# Patient Record
Sex: Male | Born: 1940 | Hispanic: No | Smoking: Never smoker
Health system: Southern US, Community
[De-identification: ages and names within clinical notes are randomized; demographics above are authoritative.]

## PROBLEM LIST (undated history)

## (undated) DIAGNOSIS — I219 Acute myocardial infarction, unspecified: Secondary | ICD-10-CM

## (undated) DIAGNOSIS — I6529 Occlusion and stenosis of unspecified carotid artery: Secondary | ICD-10-CM

## (undated) DIAGNOSIS — I2510 Atherosclerotic heart disease of native coronary artery without angina pectoris: Secondary | ICD-10-CM

## (undated) DIAGNOSIS — E119 Type 2 diabetes mellitus without complications: Secondary | ICD-10-CM

## (undated) HISTORY — DX: Type 2 diabetes mellitus without complications: E11.9

## (undated) HISTORY — DX: Occlusion and stenosis of unspecified carotid artery: I65.29

## (undated) HISTORY — DX: Acute myocardial infarction, unspecified: I21.9

## (undated) HISTORY — DX: Atherosclerotic heart disease of native coronary artery without angina pectoris: I25.10

---

## 1997-05-30 HISTORY — PX: CORONARY ARTERY BYPASS GRAFT: SHX141

## 2004-05-30 HISTORY — PX: OTHER SURGICAL HISTORY: SHX169

## 2009-12-23 ENCOUNTER — Ambulatory Visit (HOSPITAL_BASED_OUTPATIENT_CLINIC_OR_DEPARTMENT_OTHER): Admission: RE | Admit: 2009-12-23 | Discharge: 2009-12-23 | Payer: Self-pay | Admitting: Ophthalmology

## 2010-08-14 LAB — POCT I-STAT 4, (NA,K, GLUC, HGB,HCT)
Glucose, Bld: 151 mg/dL — ABNORMAL HIGH (ref 70–99)
HCT: 45 % (ref 39.0–52.0)
Hemoglobin: 15.3 g/dL (ref 13.0–17.0)

## 2015-08-11 ENCOUNTER — Other Ambulatory Visit: Payer: Self-pay | Admitting: *Deleted

## 2015-08-11 DIAGNOSIS — I6521 Occlusion and stenosis of right carotid artery: Secondary | ICD-10-CM

## 2015-09-04 ENCOUNTER — Encounter: Payer: Self-pay | Admitting: Vascular Surgery

## 2015-09-15 ENCOUNTER — Ambulatory Visit (INDEPENDENT_AMBULATORY_CARE_PROVIDER_SITE_OTHER): Payer: Medicare Other | Admitting: Vascular Surgery

## 2015-09-15 ENCOUNTER — Encounter: Payer: Self-pay | Admitting: Vascular Surgery

## 2015-09-15 ENCOUNTER — Ambulatory Visit (HOSPITAL_COMMUNITY)
Admission: RE | Admit: 2015-09-15 | Discharge: 2015-09-15 | Disposition: A | Discharge status: Home / Self Care | Payer: Medicare Other | Source: Ambulatory Visit | Attending: Vascular Surgery | Admitting: Vascular Surgery

## 2015-09-15 VITALS — BP 133/75 | HR 56 | Temp 98.6°F | Resp 18 | Ht 71.0 in | Wt 181.6 lb

## 2015-09-15 DIAGNOSIS — E119 Type 2 diabetes mellitus without complications: Secondary | ICD-10-CM | POA: Diagnosis not present

## 2015-09-15 DIAGNOSIS — I6521 Occlusion and stenosis of right carotid artery: Secondary | ICD-10-CM

## 2015-09-15 DIAGNOSIS — I2510 Atherosclerotic heart disease of native coronary artery without angina pectoris: Secondary | ICD-10-CM | POA: Diagnosis not present

## 2015-09-15 NOTE — Progress Notes (Signed)
[facility]  Patient name: Maxwell Chan MRN: [id number] DOB: [birth year] Sex: male  REASON FOR VISIT: Carotid stenosis, he is referred by Dr.Chiu from [facility]  HPI: Maxwell Chan is a 75 y.o. male,  who presents for evaluation of right carotid stenosis. His right carotid stenosis was found on routine medical screening. He had a carotid duplex performed at an outside facility that revealed approximately 72% right internal carotid artery stenosis. He had repeat carotid duplex here.  The patient denies a history of TIA or stroke. He denies a history of amaurosis fugax, sudden onset numbness or weakness, and expressive or receptive aphasia. He does describe floaters in his right eye. This occurred 6 months ago and has been evaluated by an ophthalmologist.   The patient is retired. He stays active and exercises regularly. 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 He has never been a smoker. He has diabetes managed on oral medications. He is on a statin for hyperlipidemia.  He denies any recent history of chest pain or shortness of breath.  Past Medical History  Diagnosis Date  . Diabetes mellitus without complication (HCC)   . Carotid artery occlusion   . Myocardial infarction (HCC)   . Coronary heart disease     Family History  Problem Relation Age of Onset  . Heart disease Mother   . Heart disease Father     SOCIAL HISTORY: Social History   Social History  . Marital Status: Divorced    Spouse Name: N/A  . Number of Children: N/A  . Years of Education: N/A   Occupational History  . Not on file.   Social History Main Topics  . Smoking status: Never Smoker   . Smokeless tobacco: Not on file  . Alcohol Use: Yes     Comment: 1 glass of wine nightly  . Drug Use: No  . Sexual Activity: Not on file   Other Topics Concern  . Not on file   Social History Narrative    . No narrative on file    No Known Allergies  Current Outpatient Prescriptions  Medication Sig Dispense Refill  . aspirin 81 MG chewable tablet Chew 81 mg by mouth daily.    . clopidogrel (PLAVIX) 75 MG tablet Take 75 mg by mouth daily.    Marland Kitchen lisinopril (PRINIVIL,ZESTRIL) 2.5 MG tablet Take 2.5 mg by mouth daily.    . metFORMIN (GLUCOPHAGE) 500 MG tablet 500 mg 2 (two) times daily.    . metoprolol succinate (TOPROL-XL) 25 MG 24 hr tablet Take 25 mg by mouth daily.    Marland Kitchen omeprazole (PRILOSEC) 40 MG capsule daily.    . rosuvastatin (CRESTOR) 40 MG tablet Take 40 mg by mouth daily.    . sildenafil (VIAGRA) 50 MG tablet Take 50 mg by mouth.     No current facility-administered medications for this visit.    REVIEW OF SYSTEMS:   denotes positive finding,  denotes negative finding Cardiac  Comments:  Chest pain or chest pressure:    Shortness of breath upon exertion:    Short of breath when lying flat:    Irregular heart rhythm:        Vascular    Pain in calf, thigh, or hip brought on by ambulation:    Pain in feet at night that wakes you up from your sleep:     Blood clot in your veins:  Leg swelling:         Pulmonary    Oxygen at home:    Productive cough:     Wheezing:         Neurologic    Sudden weakness in arms or legs:     Sudden numbness in arms or legs:     Sudden onset of difficulty speaking or slurred speech:    Temporary loss of vision in one eye:     Problems with dizziness:         Gastrointestinal    Blood in stool:     Vomited blood:         Genitourinary    Burning when urinating:     Blood in urine:        Psychiatric    Major depression:         Hematologic    Bleeding problems:    Problems with blood clotting too easily:        Skin    Rashes or ulcers:        Constitutional    Fever or chills:      PHYSICAL EXAM: Filed Vitals:   09/15/15 1313  BP: 133/75  Pulse: 56  Temp: 98.6 F (37 C)  TempSrc: Oral  Resp: 18   Height: 5\' 11"  (1.803 m)  Weight: 181 lb 9.6 oz (82.373 kg)  SpO2: 100%    GENERAL: The patient is a well-nourished male, in no acute distress. The vital signs are documented above. CARDIAC: There is a regular rate and rhythm. No carotid bruits VASCULAR: 2+ radial pulses symmetric.  PULMONARY: There is good air exchange bilaterally without wheezing or rales. ABDOMEN: Soft and non-tender. No palpable masses. Non-palpable aorta. MUSCULOSKELETAL: There are no major deformities or cyanosis. NEUROLOGIC: 5 out of 5 strength upper and lower extremities bilaterally. Cranial nerves II-XII grossly intact. No focal weakness or paresthesias are detected. SKIN: There are no ulcers or rashes noted. PSYCHIATRIC: The patient has a normal affect.  DATA:  Right carotid duplex 09/15/2015  Right internal carotid artery stenosis 60-79%. Velocities 256/66. Right vertebral flow is antegrade  Duplex from outside facility on 08/04/2015 reveals approximately 72% right proximal ICA stenosis.  Less than 40% stenosis left internal carotid artery. Antegrade left vertebral artery flow.   MEDICAL ISSUES: Asymptomatic right internal carotid artery stenosis 60-79%  The patient has had no symptoms of TIA or stroke. The patient is on maximal medical management with aspirin and statin. He is not a smoker. Discussed carotid endarterectomy if his stenosis reaches 80% or if he becomes symptomatic. He will follow up in 6 months with repeat carotid duplex. He knows to contact the office sooner if he develops any TIA or stroke symptoms.   Maris BergerKimberly Trinh, PA-C [facility] [place] [phone]    I have examined the patient, reviewed and agree with above. Had a long discussion with the patient and his daughter present. Explain symptoms of carotid disease. Recommend that we see him again at 6 month intervals to rule out any progression in his stenosis. Explained that he is somewhat below her recommended  threshold for elective repair with no symptoms.  Gretta BeganEarly, Kathren Scearce, MD 09/15/2015 2:07 PM

## 2015-10-23 ENCOUNTER — Other Ambulatory Visit: Payer: Self-pay | Admitting: *Deleted

## 2015-10-23 DIAGNOSIS — I6523 Occlusion and stenosis of bilateral carotid arteries: Secondary | ICD-10-CM

## 2016-02-27 ENCOUNTER — Other Ambulatory Visit: Payer: Self-pay | Admitting: Internal Medicine

## 2016-02-27 DIAGNOSIS — M5440 Lumbago with sciatica, unspecified side: Secondary | ICD-10-CM

## 2016-03-04 ENCOUNTER — Other Ambulatory Visit: Payer: Self-pay | Admitting: Internal Medicine

## 2016-03-04 DIAGNOSIS — M545 Low back pain, unspecified: Secondary | ICD-10-CM

## 2016-03-18 ENCOUNTER — Inpatient Hospital Stay: Admission: RE | Admit: 2016-03-18 | Payer: Medicare Other | Source: Ambulatory Visit

## 2016-03-24 ENCOUNTER — Encounter: Payer: Self-pay | Admitting: Family

## 2016-03-29 ENCOUNTER — Ambulatory Visit (INDEPENDENT_AMBULATORY_CARE_PROVIDER_SITE_OTHER): Payer: Medicare Other | Admitting: Family

## 2016-03-29 ENCOUNTER — Ambulatory Visit (HOSPITAL_COMMUNITY)
Admission: RE | Admit: 2016-03-29 | Discharge: 2016-03-29 | Disposition: A | Discharge status: Home / Self Care | Payer: Medicare Other | Source: Ambulatory Visit | Attending: Vascular Surgery | Admitting: Vascular Surgery

## 2016-03-29 ENCOUNTER — Encounter: Payer: Self-pay | Admitting: Family

## 2016-03-29 VITALS — BP 127/64 | HR 48 | Temp 98.1°F | Resp 18 | Ht 71.0 in | Wt 170.0 lb

## 2016-03-29 DIAGNOSIS — I6523 Occlusion and stenosis of bilateral carotid arteries: Secondary | ICD-10-CM

## 2016-03-29 DIAGNOSIS — I6521 Occlusion and stenosis of right carotid artery: Secondary | ICD-10-CM | POA: Diagnosis not present

## 2016-03-29 NOTE — Patient Instructions (Signed)
Stroke Prevention Some medical conditions and behaviors are associated with an increased chance of having a stroke. You may prevent a stroke by making healthy choices and managing medical conditions. HOW CAN I REDUCE MY RISK OF HAVING A STROKE?   Stay physically active. Get at least 30 minutes of activity on most or all days.  Do not smoke. It may also be helpful to avoid exposure to secondhand smoke.  Limit alcohol use. Moderate alcohol use is considered to be:  No more than 2 drinks per day for men.  No more than 1 drink per day for nonpregnant women.  Eat healthy foods. This involves:  Eating 5 or more servings of fruits and vegetables a day.  Making dietary changes that address high blood pressure (hypertension), high cholesterol, diabetes, or obesity.  Manage your cholesterol levels.  Making food choices that are high in fiber and low in saturated fat, trans fat, and cholesterol may control cholesterol levels.  Take any prescribed medicines to control cholesterol as directed by your health care provider.  Manage your diabetes.  Controlling your carbohydrate and sugar intake is recommended to manage diabetes.  Take any prescribed medicines to control diabetes as directed by your health care provider.  Control your hypertension.  Making food choices that are low in salt (sodium), saturated fat, trans fat, and cholesterol is recommended to manage hypertension.  Ask your health care provider if you need treatment to lower your blood pressure. Take any prescribed medicines to control hypertension as directed by your health care provider.  If you are 18-39 years of age, have your blood pressure checked every 3-5 years. If you are 40 years of age or older, have your blood pressure checked every year.  Maintain a healthy weight.  Reducing calorie intake and making food choices that are low in sodium, saturated fat, trans fat, and cholesterol are recommended to manage  weight.  Stop drug abuse.  Avoid taking birth control pills.  Talk to your health care provider about the risks of taking birth control pills if you are over 35 years old, smoke, get migraines, or have ever had a blood clot.  Get evaluated for sleep disorders (sleep apnea).  Talk to your health care provider about getting a sleep evaluation if you snore a lot or have excessive sleepiness.  Take medicines only as directed by your health care provider.  For some people, aspirin or blood thinners (anticoagulants) are helpful in reducing the risk of forming abnormal blood clots that can lead to stroke. If you have the irregular heart rhythm of atrial fibrillation, you should be on a blood thinner unless there is a good reason you cannot take them.  Understand all your medicine instructions.  Make sure that other conditions (such as anemia or atherosclerosis) are addressed. SEEK IMMEDIATE MEDICAL CARE IF:   You have sudden weakness or numbness of the face, arm, or leg, especially on one side of the body.  Your face or eyelid droops to one side.  You have sudden confusion.  You have trouble speaking (aphasia) or understanding.  You have sudden trouble seeing in one or both eyes.  You have sudden trouble walking.  You have dizziness.  You have a loss of balance or coordination.  You have a sudden, severe headache with no known cause.  You have new chest pain or an irregular heartbeat. Any of these symptoms may represent a serious problem that is an emergency. Do not wait to see if the symptoms will   go away. Get medical help at once. Call your local emergency services (911 in [place] Do not drive yourself to the hospital.   This information is not intended to replace advice given to you by your health care provider. Make sure you discuss any questions you have with your health care provider.   Document Released: 06/23/2004 Document Revised: 06/06/2014 Document Reviewed:  11/16/2012 Elsevier Interactive Patient Education 2016 [employer] Inc[employer]  

## 2016-03-29 NOTE — Progress Notes (Signed)
Chief Complaint: Follow up Extracranial Carotid Artery Stenosis   History of Present Illness  Maxwell Chan is a 75 y.o. male patient that Dr. Arbie CookeyEarly and Karsten RoKim Trinh PA-C initially evaluated on 09/15/15 for right carotid artery stenosis. His right carotid stenosis was found on routine medical screening. He had a carotid duplex performed at an outside facility that revealed approximately 72% right internal carotid artery stenosis. He had repeat carotid duplex here.  The patient denies a history of TIA or stroke. He denies a history of amaurosis fugax, sudden onset numbness or weakness, and expressive or receptive aphasia. He does describe floaters in his right eye. This has been evaluated by an ophthalmologist.   The patient is retired. He stays active and exercises regularly.  He has never been a smoker. He has diabetes managed on oral medications. He is on a statin for hyperlipidemia.  He denies any recent history of chest pain or shortness of breath. He denies any feeling of light headedness.   Patient has not had previous carotid artery intervention.  He had 3 vessel CABG in 2000, denies any hx of MI, had a couple of cardiac stents placed since then.  Pt Diabetic: yes, states his last A1C was 7.? Pt smoker: non-smoker  Pt meds include: Statin : yes ASA: yes Other anticoagulants/antiplatelets: Plavix     Past Medical History:  Diagnosis Date  . Carotid artery occlusion   . Coronary heart disease   . Diabetes mellitus without complication (HCC)   . Myocardial infarction     Social History Social History  Substance Use Topics  . Smoking status: Never Smoker  . Smokeless tobacco: Never Used  . Alcohol use Yes     Comment: 1 glass of wine nightly    Family History Family History  Problem Relation Age of Onset  . Heart disease Mother   . Heart disease Father     Surgical History Past Surgical History:  Procedure Laterality Date  . cardiac stents  2006  .  CORONARY ARTERY BYPASS GRAFT  1999    No Known Allergies  Current Outpatient Prescriptions  Medication Sig Dispense Refill  . aspirin 81 MG chewable tablet Chew 81 mg by mouth daily.    . clopidogrel (PLAVIX) 75 MG tablet Take 75 mg by mouth daily.    Marland Kitchen. lisinopril (PRINIVIL,ZESTRIL) 2.5 MG tablet Take 2.5 mg by mouth daily.    . metFORMIN (GLUCOPHAGE) 500 MG tablet 500 mg 2 (two) times daily.    . metoprolol succinate (TOPROL-XL) 25 MG 24 hr tablet Take 25 mg by mouth daily.    Marland Kitchen. omeprazole (PRILOSEC) 40 MG capsule daily.    . rosuvastatin (CRESTOR) 40 MG tablet Take 40 mg by mouth daily.    . sildenafil (VIAGRA) 50 MG tablet Take 50 mg by mouth.     No current facility-administered medications for this visit.     Review of Systems : See HPI for pertinent positives and negatives.  Physical Examination  Vitals:   03/29/16 1345  BP: 139/66  Pulse: (!) 48  Resp: 18  SpO2: 100%  Weight: 170 lb (77.1 kg)  Height: 5\' 11"  (1.803 m)   Body mass index is 23.71 kg/m.  General: WDWN male in NAD GAIT: normal Eyes: PERRLA Pulmonary:  Respirations are non-labored, good air movement, CTAB,  rales,  rhonchi, &  wheezing.  Cardiac: regular rhythm, bradycardic on a beta blocker, no detected murmur.  VASCULAR EXAM Carotid Bruits Right Left   positive positive  Aorta is not palpable. Radial pulses are 2+ palpable and equal.                                                                                                                            LE Pulses Right Left       POPLITEAL  not palpable   not palpable       POSTERIOR TIBIAL   palpable    palpable        DORSALIS PEDIS      ANTERIOR TIBIAL  palpable   palpable     Gastrointestinal: soft, nontender, BS WNL, no r/g,  no palpable masses.  Musculoskeletal: no muscle atrophy/wasting. M/S 5/5 throughout, extremities without ischemic changes.  Neurologic: A&O X 3; Appropriate Affect, Speech is normal CN 2-12 intact,  pain and light touch intact in extremities, Motor exam as listed above.     Assessment: Maxwell RushingRodger Chan is a 75 y.o. male who had no history of stroke or TIA.   DATA Today's carotid duplex suggests 60-79% (high end of range) right ICA stenosis and <40% left ICA stenosis.  Bilateral vertebral arteries are antegrade. Bilateral subclavian arteries are multiphasic.  Increase in stenosis of the right ICA from low end of range to high end of 60-79% range compared to exam of 09/15/15.   Plan: Follow-up in 6 months with Carotid Duplex scan.   I discussed in depth with the patient the nature of atherosclerosis, and emphasized the importance of maximal medical management including strict control of blood pressure, blood glucose, and lipid levels, obtaining regular exercise, and continued cessation of smoking.  The patient is aware that without maximal medical management the underlying atherosclerotic disease process will progress, limiting the benefit of any interventions. The patient was given information about stroke prevention and what symptoms should prompt the patient to seek immediate medical care. Thank you for allowing us to participate in this patient's care.  Charisse MarchSuzanne Kahli Mayon, RN, MSN, FNP-C [facility] [place] Office: [phone]  Clinic Physician: Early  03/29/16 1:46 PM

## 2016-03-30 ENCOUNTER — Ambulatory Visit
Admission: RE | Admit: 2016-03-30 | Discharge: 2016-03-30 | Disposition: A | Discharge status: Home / Self Care | Payer: Medicare Other | Source: Ambulatory Visit | Attending: Internal Medicine | Admitting: Internal Medicine

## 2016-03-30 DIAGNOSIS — M545 Low back pain, unspecified: Secondary | ICD-10-CM

## 2017-11-05 IMAGING — MR MR LUMBAR SPINE W/O CM
4 of 5 series · 24 of 48 positions shown · non-contrast
Comparison: [REDACTED] lumbar radiographs 10/13/2015 and
earlier.

CLINICAL DATA: 75-year-old male with intermittent chronic lumbar
back pain, progressive for 6 months. Right buttock pain. Initial
encounter.

EXAM:
MRI LUMBAR SPINE WITHOUT CONTRAST
TECHNIQUE: Multiplanar, multisequence MR imaging of the lumbar spine was
performed. No intravenous contrast was administered.

[Series 4: T1 · sagittal · 4.0mm · 0.55mm/px · 6 of 13 slices shown (1 of 2)]
[im 1/13]
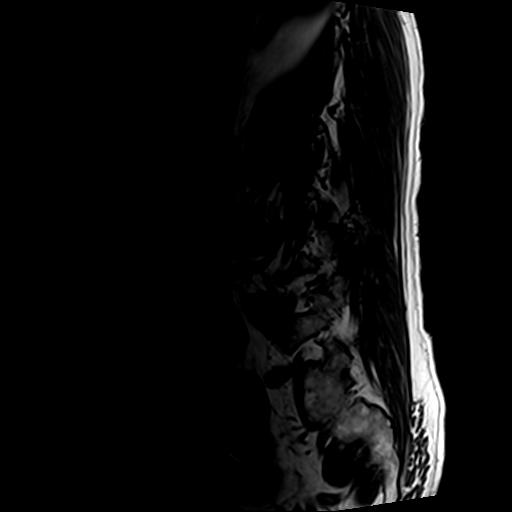
[im 3/13]
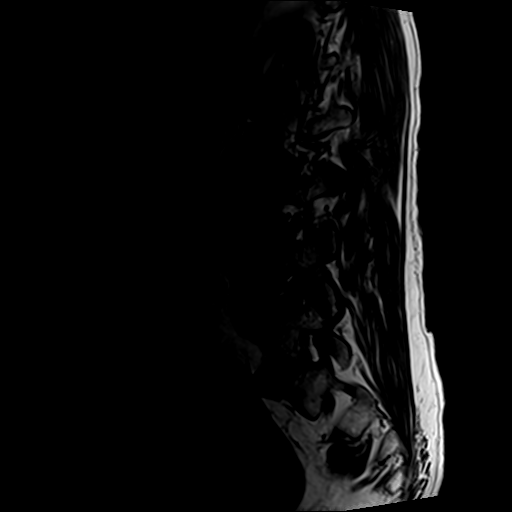
[im 5/13]
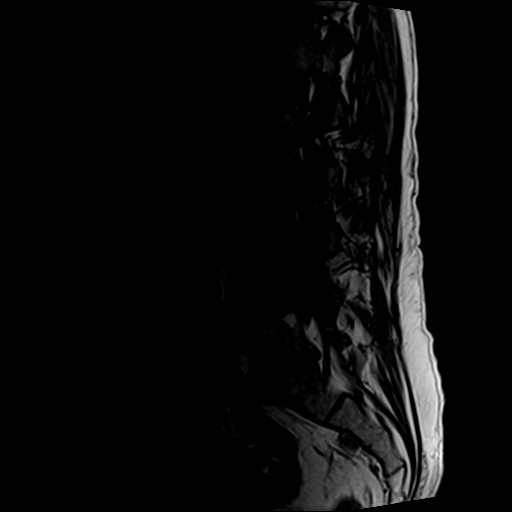
[im 8/13]
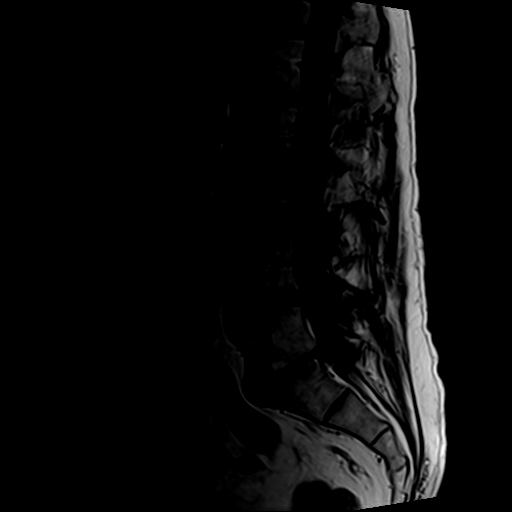
[im 10/13]
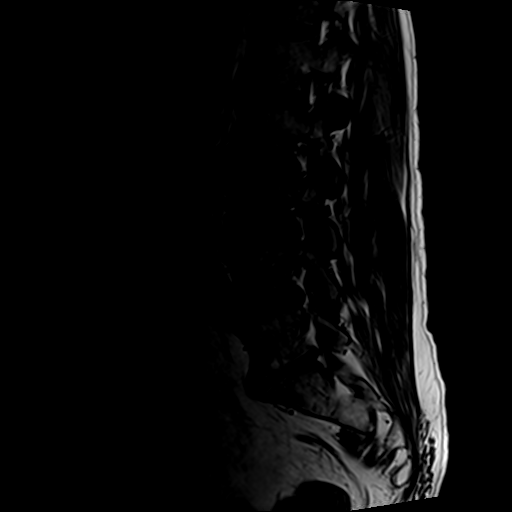
[im 13/13]
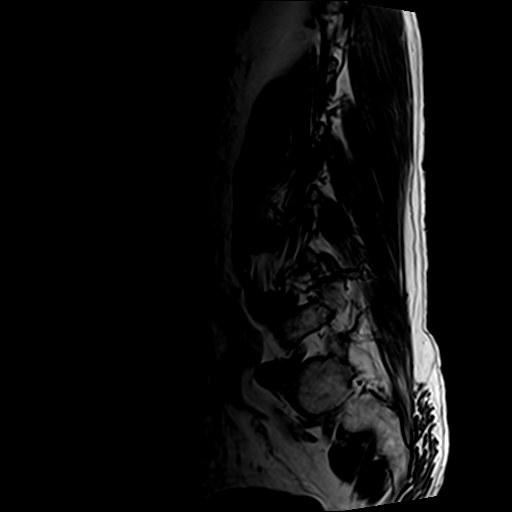

[Series 5: T2 · sagittal · 4.0mm · 0.55mm/px · 6 of 13 slices shown (1 of 2)]
[im 1/13]
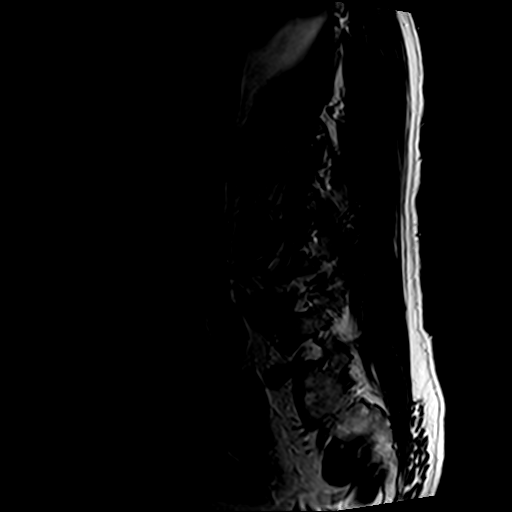
[im 3/13]
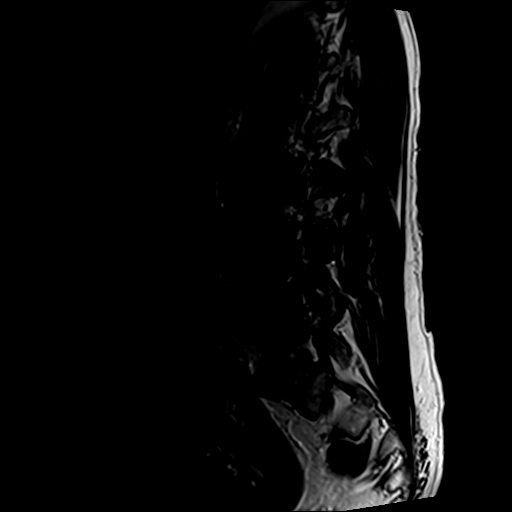
[im 5/13]
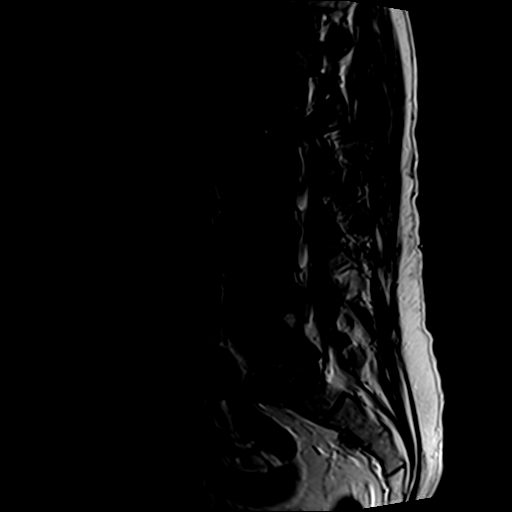
[im 8/13]
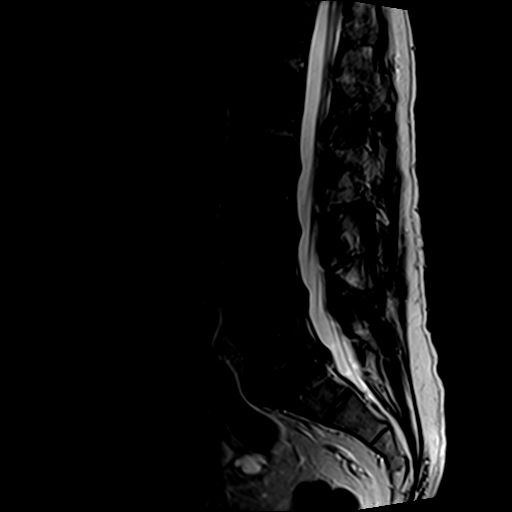
[im 10/13]
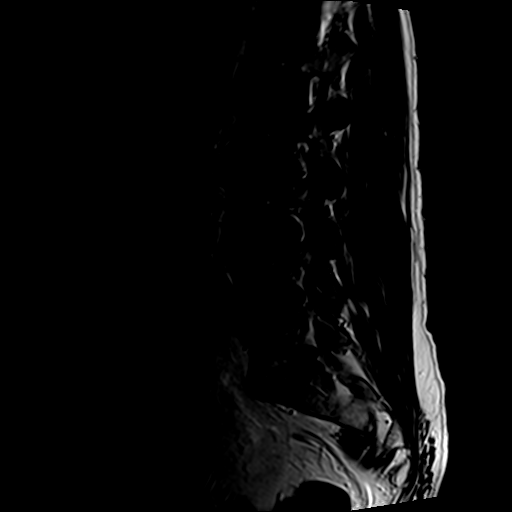
[im 13/13]
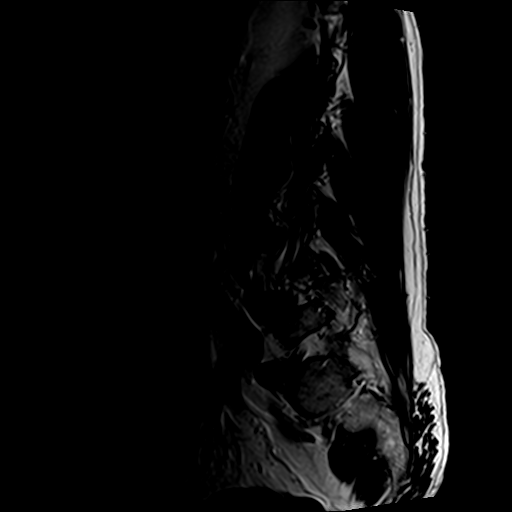

[Series 6: T2 · axial · 4.0mm · 0.70mm/px · z∈[-95,+93]mm · 9 of 35 slices shown (2 of 2)]
[im 1/35]
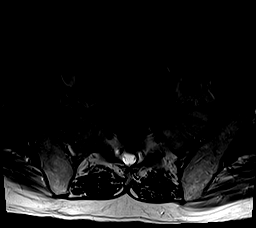
[im 5/35]
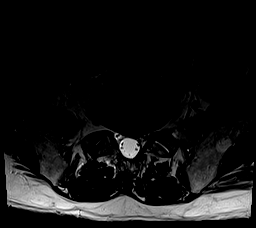
[im 10/35]
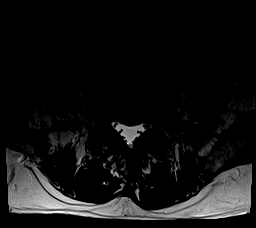
[im 15/35]
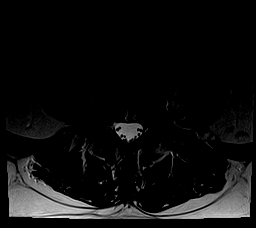
[im 18/35]
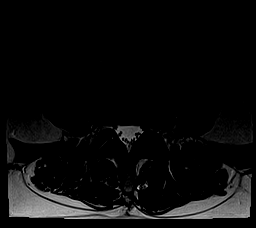
[im 20/35]
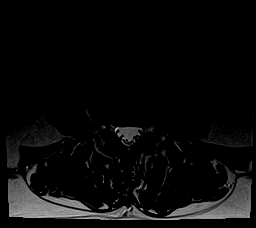
[im 25/35]
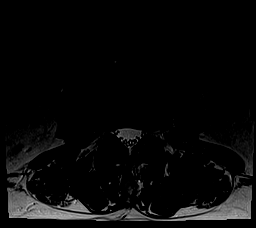
[im 30/35]
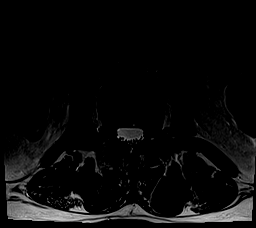
[im 35/35]
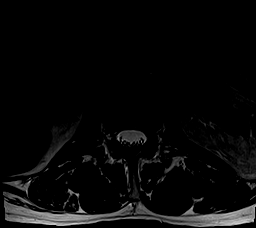

[Series 7: T1 · axial · 4.0mm · 0.35mm/px · z∈[-75,+67]mm · 3 of 35 slices shown (2 of 2)]
[im 5/35]
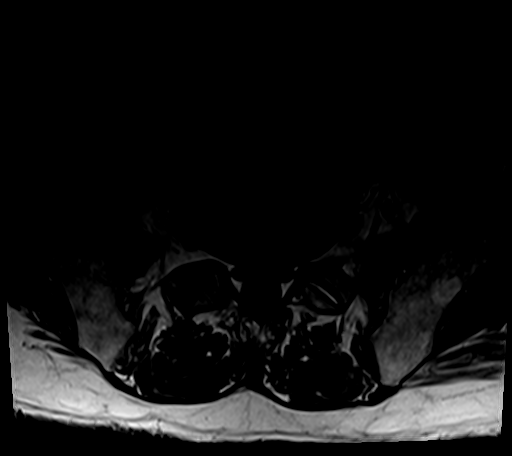
[im 18/35]
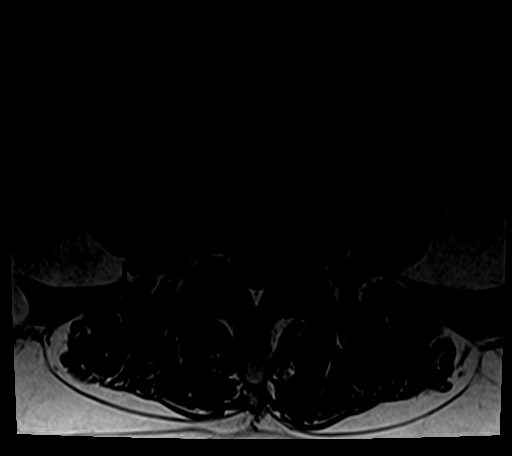
[im 30/35]
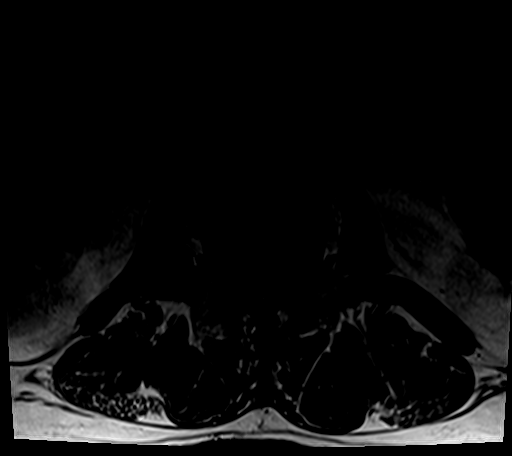

[24 of 48 positions shown; findings below may reference images not displayed]

FINDINGS: Segmentation:  Normal as seen on the comparison radiographs.

Alignment: Mild chronic retrolisthesis of L5 on S1 appears stable
since 4134. Subtle retrolisthesis of L4 on L5 is stable. Elsewhere
normal vertebral height and alignment.

Vertebrae: Normal bone marrow signal. No marrow edema or evidence of
acute osseous abnormality.

Conus medullaris: Not visible, the conus most terminated above the
T12-L1 level. Cauda equina nerve roots appear normal.

Paraspinal and other soft tissues: Pelvic kidney in the midline and
to the left anterior to L5 an the sacrum. Partially visible
orthotopic left kidney. Otherwise negative abdominal viscera.
Negative visualized posterior paraspinal soft tissues.

Disc levels:
T12-L1:  Negative.

L1-L2:  Negative.

L2-L3: Disc desiccation with mild disc space loss and
circumferential disc bulge. Borderline to mild facet hypertrophy.
Borderline to mild bilateral L2 foraminal stenosis.

L3-L4: Disc desiccation with mild disc space loss and
circumferential disc bulge. Superimposed left foraminal
irregular-shaped disc extrusion. Associated nearby a left foraminal
annular fissure of the disc. See sagittal images 11 and series 7,
image 16. Superimposed mild facet hypertrophy. Trace right facet
joint fluid. No spinal or lateral recess stenosis. Moderate to
severe left foraminal an extra foraminal stenosis at the level of
the left L3 nerve. No right foraminal stenosis.

L4-L5: Mild disc desiccation and disc space loss. Mild
circumferential disc bulge. Mild facet hypertrophy. Trace facet
joint fluid. There might be a small, subtle superimposed left
lateral recess disc protrusion as seen on series 5, image 9. There
is no spinal stenosis. Effaced descending left L5 nerve roots in the
left lateral recess. Superimposed obscuration of the exiting right
L4 nerve appears related to stenosis due in part to facet
hypertrophy but also a a probable small right foraminal disc
protrusion (see series 4, image 3 and series 7, image 24).

L5-S1: Severe disc space loss. Circumferential disc osteophyte
complex eccentric to the left and with broad-based posterior
component. Small left paracentral and slightly caudal disc
protrusion (series 5, image 8 and series 6, image 31). Mild facet
hypertrophy. No spinal stenosis. Mild left lateral recess stenosis
(descending left S1 nerve root level). No foraminal stenosis.
IMPRESSION: 1. Left foraminal moderate size disc extrusion at L3-L4 with
moderate to severe involvement of the exiting left L3 nerve.
2. More subtle right foraminal disc extrusion suspected at L4-L5,
and might be the symptomatic abnormality with regard to right
buttock pain. Query right L4 radiculitis.
3. Disc related mild left lateral recess stenosis at L4-L5 and L5-S1
affecting the descending left L5 and S1 nerve roots respectively.
4. No lumbar spinal stenosis.
5. Incidental pelvic right kidney (normal variant).
